# Patient Record
Sex: Female | Born: 1950 | Race: White | Hispanic: No | Smoking: Former smoker
Health system: Southern US, Community
[De-identification: ages and names within clinical notes are randomized; demographics above are authoritative.]

## PROBLEM LIST (undated history)

## (undated) DIAGNOSIS — T7840XA Allergy, unspecified, initial encounter: Secondary | ICD-10-CM

## (undated) DIAGNOSIS — E785 Hyperlipidemia, unspecified: Secondary | ICD-10-CM

## (undated) DIAGNOSIS — E119 Type 2 diabetes mellitus without complications: Secondary | ICD-10-CM

## (undated) DIAGNOSIS — I10 Essential (primary) hypertension: Secondary | ICD-10-CM

## (undated) DIAGNOSIS — K219 Gastro-esophageal reflux disease without esophagitis: Secondary | ICD-10-CM

## (undated) HISTORY — DX: Allergy, unspecified, initial encounter: T78.40XA

## (undated) HISTORY — PX: POLYPECTOMY: SHX149

## (undated) HISTORY — DX: Gastro-esophageal reflux disease without esophagitis: K21.9

## (undated) HISTORY — DX: Essential (primary) hypertension: I10

## (undated) HISTORY — DX: Hyperlipidemia, unspecified: E78.5

## (undated) HISTORY — DX: Type 2 diabetes mellitus without complications: E11.9

---
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
## 2001-03-22 ENCOUNTER — Other Ambulatory Visit: Admission: RE | Admit: 2001-03-22 | Discharge: 2001-03-22 | Payer: Self-pay | Admitting: Obstetrics & Gynecology

## 2002-06-01 ENCOUNTER — Other Ambulatory Visit: Admission: RE | Admit: 2002-06-01 | Discharge: 2002-06-01 | Payer: Self-pay | Admitting: Obstetrics & Gynecology

## 2002-07-19 HISTORY — PX: DILATION AND CURETTAGE OF UTERUS: SHX78

## 2003-06-05 ENCOUNTER — Other Ambulatory Visit: Admission: RE | Admit: 2003-06-05 | Discharge: 2003-06-05 | Payer: Self-pay | Admitting: Obstetrics & Gynecology

## 2004-08-05 ENCOUNTER — Other Ambulatory Visit: Admission: RE | Admit: 2004-08-05 | Discharge: 2004-08-05 | Payer: Self-pay | Admitting: Obstetrics & Gynecology

## 2005-08-24 ENCOUNTER — Other Ambulatory Visit: Admission: RE | Admit: 2005-08-24 | Discharge: 2005-08-24 | Payer: Self-pay | Admitting: Obstetrics & Gynecology

## 2008-08-16 ENCOUNTER — Encounter (INDEPENDENT_AMBULATORY_CARE_PROVIDER_SITE_OTHER): Payer: Self-pay | Admitting: Obstetrics & Gynecology

## 2008-08-16 ENCOUNTER — Ambulatory Visit (HOSPITAL_COMMUNITY): Admission: RE | Admit: 2008-08-16 | Discharge: 2008-08-16 | Payer: Self-pay | Admitting: Obstetrics & Gynecology

## 2010-07-22 ENCOUNTER — Encounter
Admission: RE | Admit: 2010-07-22 | Discharge: 2010-07-22 | Payer: Self-pay | Source: Home / Self Care | Attending: Obstetrics & Gynecology | Admitting: Obstetrics & Gynecology

## 2010-07-29 ENCOUNTER — Encounter: Payer: Self-pay | Admitting: Gastroenterology

## 2010-08-20 NOTE — Letter (Signed)
Summary: Colonoscopy Date Change Letter  [facility]  [address] [place], [place] [address]   Phone: [phone]  Fax: [phone]      July 29, 2010 MRN: [id number]   [facility] [address] [place], [place]  [address]   Dear Ms. Lackey,   Previously you were recommended to have a repeat colonoscopy around this time. Your chart was recently reviewed by Dr. Claudette Head of [facility]. Follow up colonoscopy is now recommended in January 2015. This revised recommendation is based on current, nationally recognized guidelines for colorectal cancer screening and polyp surveillance. These guidelines are endorsed by the American Cancer Society, [employer] on Colorectal Cancer as well as numerous other major medical organizations.  Please understand that our recommendation assumes that you do not have any new symptoms such as bleeding, a change in bowel habits, anemia, or significant abdominal discomfort. If you do have any concerning GI symptoms or want to discuss the guideline recommendations, please call to arrange an office visit at your earliest convenience. Otherwise we will keep you in our reminder system and contact you 1-2 months prior to the date listed above to schedule your next colonoscopy.  Thank you,  Judie Petit T. Russella Dar, M.D.  [facility] [phone]

## 2010-11-02 LAB — CBC
HCT: 44.2 % (ref 36.0–46.0)
Hemoglobin: 14.8 g/dL (ref 12.0–15.0)
MCHC: 33.5 g/dL (ref 30.0–36.0)
MCV: 89.9 fL (ref 78.0–100.0)
Platelets: 222 10*3/uL (ref 150–400)
RBC: 4.92 MIL/uL (ref 3.87–5.11)
RDW: 14.4 % (ref 11.5–15.5)
WBC: 5.6 10*3/uL (ref 4.0–10.5)

## 2010-12-01 NOTE — Op Note (Signed)
NAMESHAMARRA, Brooke Moore                 ACCOUNT NO.:  [id number]   MEDICAL RECORD NO.:  [id number]          PATIENT TYPE:  AMB   LOCATION:  SDC                           FACILITY:  [facility]   PHYSICIAN:  Ilda Mori, M.D.   DATE OF BIRTH:  1951-01-26   DATE OF PROCEDURE:  DATE OF DISCHARGE:                               OPERATIVE REPORT   PREOPERATIVE DIAGNOSES:  Postmenopausal bleeding and intrauterine mass.   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   PROCEDURE:  Hysteroscopy, myomectomy, and D&C.   SURGEON:  Ilda Mori, MD.   ANESTHESIA:  General.   ESTIMATED BLOOD LOSS:  Minimal.   FINDINGS:  There was a 2-3 cm submucous myoma.  There were two small 2-  mm cervical polyps.  The endometrium otherwise looked normal on  hysteroscopy, and there was no specimen obtained on D&C.   INDICATIONS:  This is a 60 year old gravida 2, para 2 female who has had  several episodes of abnormal vaginal bleeding.  The first occurred in  2006 as a perimenopausal period.  Office [place] at that time revealed a  simple hyperplasia with no atypia.  Two years later when she was in the  postmenopausal state, she had an episode of bleeding.  An ultrasound was  performed, which showed a thickened endometrial stripe of 1.4 cm and a  possible mass noted.  On office endometrial biopsy, inactive endometrium  was obtained.  The patient had another episode of postmenopausal  bleeding in December 2009 and decision was made to proceed with  hysteroscopy and D&C.   PROCEDURE IN DETAIL:  The patient was taken to the operating room and  general anesthesia was induced.  She was then placed in dorsal supine  position and the vulva and vagina and perineum were prepped and draped  in sterile fashion.  The bladder was catheterized.  The anterior lip of  cervix grasped with single-tooth tenaculum and then the uterus sounded  to 7 cm.  The internal os was dilated with Shawnie Pons  dilators to a 90-  [place].  A diagnostic scope was introduced and a 2-3 cm submucosal myoma  was identified.  The rest of the endometrium appeared normal.  The  internal os was dilated to a 25-French and a resectoscope was introduced  and the myoma was shaved down with a double loop resectoscope.  Fragments of the myoma were removed progressively.  Finally, the myoma  was grasped with a polyp forceps and the remainder of the myoma was  removed with traction with the polyp forceps.  The  hysteroscope was reintroduced and the myoma was completely removed and  the rest of the endometrium appeared normal.  A D&C was performed at  this time and no significant tissue was obtained.  The procedure was  then terminated, and the patient left the operating room in good  condition.      Ilda Mori, M.D.  Electronically Signed     RK/MEDQ  D:  08/16/2008  T:  08/16/2008  Job:  [id number]

## 2011-06-11 ENCOUNTER — Encounter: Payer: Self-pay | Admitting: *Deleted

## 2011-06-11 ENCOUNTER — Emergency Department (HOSPITAL_BASED_OUTPATIENT_CLINIC_OR_DEPARTMENT_OTHER)
Admission: EM | Admit: 2011-06-11 | Discharge: 2011-06-11 | Disposition: A | Discharge status: Home / Self Care | Payer: BC Managed Care – PPO | Attending: Emergency Medicine | Admitting: Emergency Medicine

## 2011-06-11 DIAGNOSIS — M7710 Lateral epicondylitis, unspecified elbow: Secondary | ICD-10-CM | POA: Insufficient documentation

## 2011-06-11 DIAGNOSIS — F17200 Nicotine dependence, unspecified, uncomplicated: Secondary | ICD-10-CM | POA: Insufficient documentation

## 2011-06-11 DIAGNOSIS — M79609 Pain in unspecified limb: Secondary | ICD-10-CM | POA: Insufficient documentation

## 2011-06-11 DIAGNOSIS — IMO0002 Reserved for concepts with insufficient information to code with codable children: Secondary | ICD-10-CM

## 2011-06-11 MED ORDER — IBUPROFEN 600 MG PO TABS: 600.0000 mg | ORAL_TABLET | Freq: Four times a day (QID) | ORAL | Status: AC | PRN

## 2011-06-11 MED ORDER — IBUPROFEN 400 MG PO TABS
600.0000 mg | ORAL_TABLET | Freq: Once | ORAL | Status: AC
  Administered 2011-06-11: 600 mg via ORAL
  Filled 2011-06-11: qty 1

## 2011-06-11 NOTE — ED Provider Notes (Signed)
History     CSN: [id number] Arrival date & time: 06/11/2011 10:54 AM   None     Chief Complaint  Patient presents with  . Arm Pain    (Consider location/radiation/quality/duration/timing/severity/associated sxs/prior treatment) Patient is a 60 y.o. female presenting with arm pain. The history is provided by the patient.  Arm Pain Pertinent negatives include no chest pain and no shortness of breath.  pt c/o left elbow pain laterally for past few days. Constant. Dull. Worse w palpation over area soreness. No pain w flex/ext of elbow. States occasionally is getting numbness/tingling sensation diffusely to palm of hand. No neck, shoulder or radicular pain. No weakness or loss of dexterity or function. No injury or strain. No new sports or new repetitive use arm - work does involve mod amt time spent at computer/typing. No wrist or hand pain. No hx tendonitis in elbow. No hx carpal tunnel. No swelling to forearm or hand. No skin changes or erythema, no rash.   History reviewed. No pertinent past medical history.  Past Surgical History  Procedure Date  . Tonsillectomy   . Dilation and curettage of uterus     No family history on file.  History  Substance Use Topics  . Smoking status: Current Everyday Smoker  . Smokeless tobacco: Not on file  . Alcohol Use: Yes    OB History    Grav Para Term Preterm Abortions TAB SAB Ect Mult Living                  Review of Systems  Constitutional: Negative for fever and chills.  Respiratory: Negative for shortness of breath.   Cardiovascular: Negative for chest pain.  Skin: Negative for rash.  Neurological: Negative for weakness.    Allergies  Review of patient's allergies indicates no known allergies.  Home Medications  No current outpatient prescriptions on file.  BP 161/89  Pulse 78  Temp 97.8 F (36.6 C)  Resp 18  Physical Exam  Nursing note and vitals reviewed. Constitutional: She appears well-developed and  well-nourished. No distress.  Eyes: Conjunctivae are normal. No scleral icterus.  Neck: Neck supple. No tracheal deviation present.  Cardiovascular: Normal rate.   Pulmonary/Chest: Effort normal. No respiratory distress.  Abdominal: Normal appearance.  Musculoskeletal: She exhibits no edema.       Focal pain/tenderness lat epicondyle elbow. Good rom at shoulder, elbow, and wrist without pain. Radial pulse 2+. No swelling of forearm or hand. Hand nvi.   Lymphadenopathy:       No epitroch or axillary l/a  Neurological: She is alert.       Motor intact  Skin: Skin is warm and dry. No rash noted.  Psychiatric: She has a normal mood and affect.    ED Course  Procedures (including critical care time)  Labs Reviewed - No data to display No results found.   No diagnosis found.    MDM  Exam c/w epicondylitis/tendonitis. Motrin po.         Suzi Roots, MD 06/11/11 1115

## 2011-06-11 NOTE — ED Notes (Signed)
Pt c/o left arm pain from the elbow down x3 days. Pt sts today her left fingers are tingling and her hand is itching.

## 2013-06-20 ENCOUNTER — Encounter: Payer: Self-pay | Admitting: Gastroenterology

## 2013-12-20 ENCOUNTER — Encounter: Payer: Self-pay | Admitting: Gastroenterology

## 2014-01-02 ENCOUNTER — Telehealth: Payer: Self-pay | Admitting: Gastroenterology

## 2014-01-02 ENCOUNTER — Encounter: Payer: Self-pay | Admitting: Gastroenterology

## 2014-01-02 NOTE — Telephone Encounter (Signed)
error 

## 2014-02-07 ENCOUNTER — Ambulatory Visit (AMBULATORY_SURGERY_CENTER): Payer: Self-pay | Admitting: *Deleted

## 2014-02-07 VITALS — Ht 63.0 in | Wt 178.8 lb

## 2014-02-07 DIAGNOSIS — Z1211 Encounter for screening for malignant neoplasm of colon: Secondary | ICD-10-CM

## 2014-02-07 MED ORDER — MOVIPREP 100 G PO SOLR: ORAL | Status: DC

## 2014-02-07 NOTE — Progress Notes (Signed)
No allergies to eggs or soy. No problems with anesthesia.  Pt given Emmi instructions for colonoscopy  No oxygen use  No diet drug use  

## 2014-02-13 ENCOUNTER — Encounter: Payer: BC Managed Care – PPO | Admitting: Gastroenterology

## 2014-02-19 ENCOUNTER — Encounter: Payer: Self-pay | Admitting: Gastroenterology

## 2014-02-19 ENCOUNTER — Ambulatory Visit (AMBULATORY_SURGERY_CENTER): Payer: BC Managed Care – PPO | Admitting: Gastroenterology

## 2014-02-19 VITALS — BP 118/75 | HR 63 | Temp 98.8°F | Resp 27 | Ht 63.0 in | Wt 178.0 lb

## 2014-02-19 DIAGNOSIS — D126 Benign neoplasm of colon, unspecified: Secondary | ICD-10-CM

## 2014-02-19 DIAGNOSIS — Z1211 Encounter for screening for malignant neoplasm of colon: Secondary | ICD-10-CM

## 2014-02-19 HISTORY — PX: COLONOSCOPY: SHX174

## 2014-02-19 MED ORDER — SODIUM CHLORIDE 0.9 % IV SOLN: 500.0000 mL | INTRAVENOUS | Status: DC

## 2014-02-19 NOTE — Op Note (Signed)
[facility]  [address] [place], [address]   COLONOSCOPY PROCEDURE REPORT PATIENT: Brooke Moore, Brooke Moore  MR#: [id number] BIRTHDATE: April 22, 1951 , 63  yrs. old GENDER: Female ENDOSCOPIST: Ladene Artist, MD, [facility] REFERRED BY: Earley Brooke, MD PROCEDURE DATE:  02/19/2014 PROCEDURE:   Colonoscopy with biopsy and snare polypectomy First Screening Colonoscopy - Avg.  risk and is 50 yrs.  old or older - No.  Prior Negative Screening - Now for repeat screening. 10 or more years since last screening  History of Adenoma - Now for follow-up colonoscopy & has been > or = to 3 yrs.  N/A  Polyps Removed Today? Yes. ASA CLASS:   Class II INDICATIONS:average risk screening. MEDICATIONS: MAC sedation, administered by CRNA and propofol (Diprivan) 340mg  IV DESCRIPTION OF PROCEDURE:   After the risks benefits and alternatives of the procedure were thoroughly explained, informed consent was obtained.  A digital rectal exam revealed no abnormalities of the rectum.   The LB [id number]  endoscope was introduced through the anus and advanced to the cecum, which was identified by both the appendix and ileocecal valve. No adverse events experienced.   The quality of the prep was excellent, using MoviPrep  The instrument was then slowly withdrawn as the colon was fully examined.  COLON FINDINGS: Two sessile polyps measuring 5 mm in size were found in the transverse colon.  A polypectomy was performed with a cold snare.  The resection was complete and the polyp tissue was completely retrieved.   Four sessile polyps ranging between 3-68mm in size were found in the sigmoid colon.  A polypectomy was performed with a cold snare and with cold forceps.  The resection was complete and the polyp tissue was completely retrieved.   Mild diverticulosis was noted in the sigmoid colon.   The colon was otherwise normal.  There was no diverticulosis, inflammation, polyps or cancers unless previously  stated.  Retroflexed views revealed no abnormalities. The time to cecum=2 minutes 15 seconds. Withdrawal time=13 minutes 10 seconds.  The scope was withdrawn and the procedure completed. COMPLICATIONS: There were no complications.  ENDOSCOPIC IMPRESSION: 1.   Two sessile polyps measuring 5 mm in the transverse colon; polypectomy performed with a cold snare 2.   Four sessile polyps ranging between 3-29mm in the sigmoid colon; polypectomy performed- cold snare and cold forceps 3.   Mild diverticulosis in the sigmoid colon  RECOMMENDATIONS: 1.  Await pathology results 2.  Repeat colonoscopy in 5 years if polyp(s) adenomatous; otherwise 10 years 3.  High fiber diet with liberal fluid intake.  eSigned:  Ladene Artist, MD, [facility] 02/19/2014 2:02 PM

## 2014-02-19 NOTE — Progress Notes (Signed)
Called to room to assist during endoscopic procedure.  Patient ID and intended procedure confirmed with present staff. Received instructions for my participation in the procedure from the performing physician.  

## 2014-02-19 NOTE — Patient Instructions (Signed)
YOU HAD AN ENDOSCOPIC PROCEDURE TODAY AT [facility]: Refer to the procedure report that was given to you for any specific questions about what was found during the examination.  If the procedure report does not answer your questions, please call your gastroenterologist to clarify.  If you requested that your care partner not be given the details of your procedure findings, then the procedure report has been included in a sealed envelope for you to review at your convenience later.  YOU SHOULD EXPECT: Some feelings of bloating in the abdomen. Passage of more gas than usual.  Walking can help get rid of the air that was put into your GI tract during the procedure and reduce the bloating. If you had a lower endoscopy (such as a colonoscopy or flexible sigmoidoscopy) you may notice spotting of blood in your stool or on the toilet paper. If you underwent a bowel prep for your procedure, then you may not have a normal bowel movement for a few days.  DIET: Your first meal following the procedure should be a light meal and then it is ok to progress to your normal diet.  A half-sandwich or bowl of soup is an example of a good first meal.  Heavy or fried foods are harder to digest and may make you feel nauseous or bloated.  Likewise meals heavy in dairy and vegetables can cause extra gas to form and this can also increase the bloating.  Drink plenty of fluids but you should avoid alcoholic beverages for 24 hours.Try to eat a high fiber diet.  ACTIVITY: Your care partner should take you home directly after the procedure.  You should plan to take it easy, moving slowly for the rest of the day.  You can resume normal activity the day after the procedure however you should NOT DRIVE or use heavy machinery for 24 hours (because of the sedation medicines used during the test).    SYMPTOMS TO REPORT IMMEDIATELY: A gastroenterologist can be reached at any hour.  During normal business hours, 8:30 AM to 5:00  PM Monday through Friday, call [phone].  After hours and on weekends, please call the GI answering service at [phone] who will take a message and have the physician on call contact you.   Following lower endoscopy (colonoscopy or flexible sigmoidoscopy):  Excessive amounts of blood in the stool  Significant tenderness or worsening of abdominal pains  Swelling of the abdomen that is new, acute  Fever of 100F or higher  FOLLOW UP: If any biopsies were taken you will be contacted by phone or by letter within the next 1-3 weeks.  Call your gastroenterologist if you have not heard about the biopsies in 3 weeks.  Our staff will call the home number listed on your records the next business day following your procedure to check on you and address any questions or concerns that you may have at that time regarding the information given to you following your procedure. This is a courtesy call and so if there is no answer at the home number and we have not heard from you through the emergency physician on call, we will assume that you have returned to your regular daily activities without incident.  SIGNATURES/CONFIDENTIALITY: You and/or your care partner have signed paperwork which will be entered into your electronic medical record.  These signatures attest to the fact that that the information above on your After Visit Summary has been reviewed and is understood.  Full  responsibility of the confidentiality of this discharge information lies with you and/or your care-partner.  Please, read all of the handouts given to you by your recovery room nurse.

## 2014-02-19 NOTE — Progress Notes (Signed)
Report to PACU, RN, vss, BBS= Clear.  

## 2014-02-20 ENCOUNTER — Telehealth: Payer: Self-pay | Admitting: *Deleted

## 2014-02-20 NOTE — Telephone Encounter (Signed)
  Follow up Call-  Call back number 02/19/2014  Post procedure Call Back phone  # [phone]  Permission to leave phone message Yes     Patient questions:  Do you have a fever, pain , or abdominal swelling? No. Pain Score  0 *  Have you tolerated food without any problems? Yes.    Have you been able to return to your normal activities? Yes.    Do you have any questions about your discharge instructions: Diet   No. Medications  No. Follow up visit  No.  Do you have questions or concerns about your Care? No.  Actions: * If pain score is 4 or above: No action needed, pain <4.

## 2014-02-28 ENCOUNTER — Encounter: Payer: Self-pay | Admitting: Gastroenterology

## 2016-01-09 ENCOUNTER — Other Ambulatory Visit: Payer: Self-pay

## 2016-01-09 ENCOUNTER — Emergency Department (HOSPITAL_BASED_OUTPATIENT_CLINIC_OR_DEPARTMENT_OTHER)
Admission: EM | Admit: 2016-01-09 | Discharge: 2016-01-09 | Disposition: A | Discharge status: Home / Self Care | Payer: BLUE CROSS/BLUE SHIELD | Attending: Emergency Medicine | Admitting: Emergency Medicine

## 2016-01-09 ENCOUNTER — Encounter (HOSPITAL_BASED_OUTPATIENT_CLINIC_OR_DEPARTMENT_OTHER): Payer: Self-pay

## 2016-01-09 DIAGNOSIS — E785 Hyperlipidemia, unspecified: Secondary | ICD-10-CM | POA: Insufficient documentation

## 2016-01-09 DIAGNOSIS — Z79899 Other long term (current) drug therapy: Secondary | ICD-10-CM | POA: Diagnosis not present

## 2016-01-09 DIAGNOSIS — I10 Essential (primary) hypertension: Secondary | ICD-10-CM | POA: Diagnosis not present

## 2016-01-09 DIAGNOSIS — Z87891 Personal history of nicotine dependence: Secondary | ICD-10-CM | POA: Insufficient documentation

## 2016-01-09 DIAGNOSIS — H81399 Other peripheral vertigo, unspecified ear: Secondary | ICD-10-CM | POA: Diagnosis not present

## 2016-01-09 DIAGNOSIS — R1110 Vomiting, unspecified: Secondary | ICD-10-CM | POA: Insufficient documentation

## 2016-01-09 DIAGNOSIS — R42 Dizziness and giddiness: Secondary | ICD-10-CM | POA: Diagnosis present

## 2016-01-09 LAB — CBG MONITORING, ED: Glucose-Capillary: 168 mg/dL — ABNORMAL HIGH (ref 65–99)

## 2016-01-09 MED ORDER — MECLIZINE HCL 25 MG PO TABS: 25.0000 mg | ORAL_TABLET | Freq: Three times a day (TID) | ORAL | Status: DC | PRN

## 2016-01-09 MED ORDER — MECLIZINE HCL 25 MG PO TABS
25.0000 mg | ORAL_TABLET | Freq: Once | ORAL | Status: AC
  Administered 2016-01-09: 25 mg via ORAL
  Filled 2016-01-09: qty 1

## 2016-01-09 NOTE — Discharge Instructions (Signed)
Benign Positional Vertigo Vertigo is the feeling that you or your surroundings are moving when they are not. Benign positional vertigo is the most common form of vertigo. The cause of this condition is not serious (is benign). This condition is triggered by certain movements and positions (is positional). This condition can be dangerous if it occurs while you are doing something that could endanger you or others, such as driving.  CAUSES In many cases, the cause of this condition is not known. It may be caused by a disturbance in an area of the inner ear that helps your brain to sense movement and balance. This disturbance can be caused by a viral infection (labyrinthitis), head injury, or repetitive motion. RISK FACTORS This condition is more likely to develop in:  Women.  People who are 50 years of age or older. SYMPTOMS Symptoms of this condition usually happen when you move your head or your eyes in different directions. Symptoms may start suddenly, and they usually last for less than a minute. Symptoms may include:  Loss of balance and falling.  Feeling like you are spinning or moving.  Feeling like your surroundings are spinning or moving.  Nausea and vomiting.  Blurred vision.  Dizziness.  Involuntary eye movement (nystagmus). Symptoms can be mild and cause only slight annoyance, or they can be severe and interfere with daily life. Episodes of benign positional vertigo may return (recur) over time, and they may be triggered by certain movements. Symptoms may improve over time. DIAGNOSIS This condition is usually diagnosed by medical history and a physical exam of the head, neck, and ears. You may be referred to a health care provider who specializes in ear, nose, and throat (ENT) problems (otolaryngologist) or a provider who specializes in disorders of the nervous system (neurologist). You may have additional testing, including:  MRI.  A CT scan.  Eye movement tests. Your  health care provider may ask you to change positions quickly while he or she watches you for symptoms of benign positional vertigo, such as nystagmus. Eye movement may be tested with an electronystagmogram (ENG), caloric stimulation, the Dix-Hallpike test, or the roll test.  An electroencephalogram (EEG). This records electrical activity in your brain.  Hearing tests. TREATMENT Usually, your health care provider will treat this by moving your head in specific positions to adjust your inner ear back to normal. Surgery may be needed in severe cases, but this is rare. In some cases, benign positional vertigo may resolve on its own in 2-4 weeks. HOME CARE INSTRUCTIONS Safety  Move slowly.Avoid sudden body or head movements.  Avoid driving.  Avoid operating heavy machinery.  Avoid doing any tasks that would be dangerous to you or others if a vertigo episode would occur.  If you have trouble walking or keeping your balance, try using a cane for stability. If you feel dizzy or unstable, sit down right away.  Return to your normal activities as told by your health care provider. Ask your health care provider what activities are safe for you. General Instructions  Take over-the-counter and prescription medicines only as told by your health care provider.  Avoid certain positions or movements as told by your health care provider.  Drink enough fluid to keep your urine clear or pale yellow.  Keep all follow-up visits as told by your health care provider. This is important. SEEK MEDICAL CARE IF:  You have a fever.  Your condition gets worse or you develop new symptoms.  Your family or friends   notice any behavioral changes.  Your nausea or vomiting gets worse.  You have numbness or a "pins and needles" sensation. SEEK IMMEDIATE MEDICAL CARE IF:  You have difficulty speaking or moving.  You are always dizzy.  You faint.  You develop severe headaches.  You have weakness in your  legs or arms.  You have changes in your hearing or vision.  You develop a stiff neck.  You develop sensitivity to light.   This information is not intended to replace advice given to you by your health care provider. Make sure you discuss any questions you have with your health care provider.   Document Released: 04/12/2006 Document Revised: 03/26/2015 Document Reviewed: 10/28/2014 Elsevier Interactive Patient Education 2016 [employer]  

## 2016-01-09 NOTE — ED Provider Notes (Signed)
CSN: [id number]     Arrival date & time 01/09/16  1849 History  By signing my name below, I, Brooke Moore, attest that this documentation has been prepared under the direction and in the presence of Brooke Etienne, DO.  Electronically signed: Roxine Moore, ED Scribe. 01/09/2016. 10:56 PM.  Chief Complaint  Patient presents with  . Dizziness   The history is provided by the patient. No language interpreter was used.   HPI Comments: Brooke Moore is a 65 y.o. female who presents to the Emergency Department complaining of sudden onset, gradual improving dizziness that she describes as off-balance PTA. Pt reports associated vomiting. She states she was working on her laptop when she felt off-balance and decided lay down. She states when she got up to go to the bathroom, she felt as though she was going to lose her balance and decided to sit down giving her no relief. Pt states she usually finds relief when closing her eyes. She states symptoms are exacerbated when standing up or sitting up. She notes symptoms improved after vomiting and coming to the emergency department. Pt denies head, neck, abdominal or chest pain, loss of appetite, trouble breathing, diarrhea, cough, congestion, or sinus problems.  Past Medical History  Diagnosis Date  . Hyperlipidemia   . Hypertension   . GERD (gastroesophageal reflux disease)    Past Surgical History  Procedure Laterality Date  . Tonsillectomy  1958  . Dilation and curettage of uterus  2004   Family History  Problem Relation Age of Onset  . Colon cancer Neg Hx    Social History  Substance Use Topics  . Smoking status: Former Smoker    Quit date: 07/20/1991  . Smokeless tobacco: Never Used  . Alcohol Use: 0.6 oz/week    1 Glasses of wine per week     Comment: occ   OB History    No data available     Review of Systems  Constitutional: Negative for fever, chills and appetite change.  HENT: Negative for congestion, rhinorrhea and sinus pressure.    Eyes: Positive for photophobia. Negative for redness and visual disturbance.  Respiratory: Negative for cough, shortness of breath and wheezing.   Cardiovascular: Negative for chest pain and palpitations.  Gastrointestinal: Positive for vomiting. Negative for nausea, abdominal pain and diarrhea.  Genitourinary: Negative for dysuria and urgency.  Musculoskeletal: Negative for myalgias and arthralgias.  Skin: Negative for pallor and wound.  Neurological: Positive for dizziness. Negative for headaches.  All other systems reviewed and are negative.     Allergies  Review of patient's allergies indicates no known allergies.  Home Medications   Prior to Admission medications   Medication Sig Start Date End Date Taking? Authorizing Provider  lisinopril (PRINIVIL,ZESTRIL) 10 MG tablet Take 10 mg by mouth daily.    Historical Provider, MD  meclizine (ANTIVERT) 25 MG tablet Take 1 tablet (25 mg total) by mouth 3 (three) times daily as needed for dizziness. 01/09/16   Brooke Etienne, DO  montelukast (SINGULAIR) 10 MG tablet Take 10 mg by mouth at bedtime.    Historical Provider, MD  pravastatin (PRAVACHOL) 20 MG tablet Take 20 mg by mouth daily.    Historical Provider, MD  ranitidine (ZANTAC) 150 MG capsule Take 150 mg by mouth 2 (two) times daily.    Historical Provider, MD   BP 131/68 mmHg  Pulse 61  Temp(Src) 98.1 F (36.7 C) (Oral)  Resp 16  Ht 5\' 3"  (1.6 m)  Wt 173 lb (  78.472 kg)  BMI 30.65 kg/m2  SpO2 98% Physical Exam  Constitutional: She is oriented to person, place, and time. She appears well-developed and well-nourished. No distress.  HENT:  Head: Normocephalic and atraumatic.  Eyes: EOM are normal. Pupils are equal, round, and reactive to light.  Neck: Normal range of motion. Neck supple.  Cardiovascular: Normal rate and regular rhythm.  Exam reveals no gallop and no friction rub.   No murmur heard. Pulmonary/Chest: Effort normal. She has no wheezes. She has no rales.   Abdominal: Soft. She exhibits no distension. There is no tenderness.  Musculoskeletal: She exhibits no edema or tenderness.  Neurological: She is alert and oriented to person, place, and time.  Benign neurological exam  Skin: Skin is warm and dry. She is not diaphoretic.  Psychiatric: She has a normal mood and affect. Her behavior is normal.  Nursing note and vitals reviewed.   ED Course  Procedures  DIAGNOSTIC STUDIES: Oxygen Saturation is 100% on RA, normal by my interpretation.  COORDINATION OF CARE: 7:29 PM Discussed treatment plan which includes prescribing meclizine with pt at bedside and pt agreed to plan.  Labs Review Labs Reviewed  CBG MONITORING, ED - Abnormal; Notable for the following:    Glucose-Capillary 168 (*)    All other components within normal limits    Imaging Review No results found. I have personally reviewed and evaluated these images and lab results as part of my medical decision-making.   EKG Interpretation   Date/Time:  Friday January 09 2016 19:02:26 EDT Ventricular Rate:  76 PR Interval:  166 QRS Duration: 80 QT Interval:  386 QTC Calculation: 434 R Axis:   76 Text Interpretation:  Normal sinus rhythm Normal ECG No old tracing to  compare Confirmed by Cathlene Gardella MD, DANIEL [phone]) on 01/09/2016 10:56:14 PM      MDM   Final diagnoses:  Peripheral vertigo, unspecified laterality    65 yo F With a chief complaint of dizziness. He describes this as unsteadiness on her feet. Worse with head movements and bending over. Denies pain. Benign neurologic exam. Patient feels that her dizziness is passed. Deny chest pain or shortness of breath with this. We'll give meclizine. Doubt stroke.   10:56 PM:  I have discussed the diagnosis/risks/treatment options with the patient and family and believe the pt to be eligible for discharge home to follow-up with PCP. We also discussed returning to the ED immediately if new or worsening sx occur. We discussed the sx  which are most concerning (e.g., sudden worsening pain, fever, inability to tolerate by mouth) that necessitate immediate return. Medications administered to the patient during their visit and any new prescriptions provided to the patient are listed below.  Medications given during this visit Medications  meclizine (ANTIVERT) tablet 25 mg (25 mg Oral Given 01/09/16 1952)    Discharge Medication List as of 01/09/2016  8:02 PM    START taking these medications   Details  meclizine (ANTIVERT) 25 MG tablet Take 1 tablet (25 mg total) by mouth 3 (three) times daily as needed for dizziness., Starting 01/09/2016, Until Discontinued, Print        The patient appears reasonably screen and/or stabilized for discharge and I doubt any other medical condition or other [facility] requiring further screening, evaluation, or treatment in the ED at this time prior to discharge.    I personally performed the services described in this documentation, which was scribed in my presence. The recorded information has been reviewed and is  accurate.    Brooke Etienne, DO 01/09/16 2256

## 2016-01-09 NOTE — ED Notes (Signed)
C/o sudden onset dizziness at 4pm-vomited later-denies HA,CP-pt was assisted from car to w/c to ED WR to triage-A/O-NAD

## 2016-06-25 ENCOUNTER — Other Ambulatory Visit: Payer: Self-pay | Admitting: Obstetrics & Gynecology

## 2016-06-25 DIAGNOSIS — R928 Other abnormal and inconclusive findings on diagnostic imaging of breast: Secondary | ICD-10-CM

## 2016-07-06 ENCOUNTER — Ambulatory Visit
Admission: RE | Admit: 2016-07-06 | Discharge: 2016-07-06 | Disposition: A | Discharge status: Home / Self Care | Payer: BLUE CROSS/BLUE SHIELD | Source: Ambulatory Visit | Attending: Obstetrics & Gynecology | Admitting: Obstetrics & Gynecology

## 2016-07-06 DIAGNOSIS — R928 Other abnormal and inconclusive findings on diagnostic imaging of breast: Secondary | ICD-10-CM

## 2019-02-12 ENCOUNTER — Encounter: Payer: Self-pay | Admitting: Gastroenterology

## 2019-02-16 ENCOUNTER — Encounter: Payer: Self-pay | Admitting: Gastroenterology

## 2019-03-21 ENCOUNTER — Ambulatory Visit (AMBULATORY_SURGERY_CENTER): Payer: Self-pay | Admitting: *Deleted

## 2019-03-21 ENCOUNTER — Other Ambulatory Visit: Payer: Self-pay

## 2019-03-21 VITALS — Temp 96.8°F | Ht 63.0 in | Wt 172.0 lb

## 2019-03-21 DIAGNOSIS — Z86010 Personal history of colonic polyps: Secondary | ICD-10-CM

## 2019-03-21 MED ORDER — NA SULFATE-K SULFATE-MG SULF 17.5-3.13-1.6 GM/177ML PO SOLN: ORAL | 0 refills | Status: DC

## 2019-03-21 NOTE — Progress Notes (Signed)
Patient is here in-person for PV. Patient denies any allergies to eggs or soy. Patient denies any problems with anesthesia/sedation. Patient denies any oxygen use at home. Patient denies taking any diet/weight loss medications or blood thinners. EMMI education assisgned to patient on colonoscopy, this was explained and instructions given to patient.Pt is aware that care partner will wait in the car during procedure; if they feel like they will be too hot to wait in the car; they may wait in the lobby.  We want them to wear a mask (we do not have any that we can provide them), practice social distancing, and we will check their temperatures when they get here.  I did remind patient that their care partner needs to stay in the parking lot the entire time. Pt will wear mask into building. Suprep coupon given to pt.

## 2019-03-27 ENCOUNTER — Encounter: Payer: Self-pay | Admitting: Gastroenterology

## 2019-04-04 ENCOUNTER — Encounter: Payer: BLUE CROSS/BLUE SHIELD | Admitting: Gastroenterology

## 2019-04-06 ENCOUNTER — Telehealth: Payer: Self-pay | Admitting: Gastroenterology

## 2019-04-06 NOTE — Telephone Encounter (Signed)

## 2019-04-09 ENCOUNTER — Other Ambulatory Visit: Payer: Self-pay

## 2019-04-09 ENCOUNTER — Ambulatory Visit (AMBULATORY_SURGERY_CENTER): Payer: BC Managed Care – PPO | Admitting: Gastroenterology

## 2019-04-09 ENCOUNTER — Encounter: Payer: Self-pay | Admitting: Gastroenterology

## 2019-04-09 ENCOUNTER — Other Ambulatory Visit: Payer: Self-pay | Admitting: Gastroenterology

## 2019-04-09 VITALS — BP 119/71 | HR 57 | Temp 98.0°F | Resp 14 | Ht 63.0 in | Wt 172.0 lb

## 2019-04-09 DIAGNOSIS — Z86010 Personal history of colonic polyps: Secondary | ICD-10-CM

## 2019-04-09 DIAGNOSIS — K635 Polyp of colon: Secondary | ICD-10-CM

## 2019-04-09 DIAGNOSIS — D125 Benign neoplasm of sigmoid colon: Secondary | ICD-10-CM

## 2019-04-09 DIAGNOSIS — D124 Benign neoplasm of descending colon: Secondary | ICD-10-CM

## 2019-04-09 DIAGNOSIS — D127 Benign neoplasm of rectosigmoid junction: Secondary | ICD-10-CM

## 2019-04-09 DIAGNOSIS — D128 Benign neoplasm of rectum: Secondary | ICD-10-CM

## 2019-04-09 MED ORDER — SODIUM CHLORIDE 0.9 % IV SOLN
500.0000 mL | Freq: Once | INTRAVENOUS | Status: DC
Start: 2019-04-09 — End: 2019-04-09

## 2019-04-09 NOTE — Progress Notes (Signed)
A/ox3, pleased with MAC, report to RN 

## 2019-04-09 NOTE — Progress Notes (Signed)
Called to room to assist during endoscopic procedure.  Patient ID and intended procedure confirmed with present staff. Received instructions for my participation in the procedure from the performing physician.  

## 2019-04-09 NOTE — Patient Instructions (Signed)
Handouts given:Polyps and Hemorrhoids   YOU HAD AN ENDOSCOPIC PROCEDURE TODAY AT THE [facility]:   Refer to the procedure report that was given to you for any specific questions about what was found during the examination.  If the procedure report does not answer your questions, please call your gastroenterologist to clarify.  If you requested that your care partner not be given the details of your procedure findings, then the procedure report has been included in a sealed envelope for you to review at your convenience later.  YOU SHOULD EXPECT: Some feelings of bloating in the abdomen. Passage of more gas than usual.  Walking can help get rid of the air that was put into your GI tract during the procedure and reduce the bloating. If you had a lower endoscopy (such as a colonoscopy or flexible sigmoidoscopy) you may notice spotting of blood in your stool or on the toilet paper. If you underwent a bowel prep for your procedure, you may not have a normal bowel movement for a few days.  Please Note:  You might notice some irritation and congestion in your nose or some drainage.  This is from the oxygen used during your procedure.  There is no need for concern and it should clear up in a day or so.  SYMPTOMS TO REPORT IMMEDIATELY:   Following lower endoscopy (colonoscopy or flexible sigmoidoscopy):  Excessive amounts of blood in the stool  Significant tenderness or worsening of abdominal pains  Swelling of the abdomen that is new, acute  Fever of 100F or higher  For urgent or emergent issues, a gastroenterologist can be reached at any hour by calling [phone].   DIET:  We do recommend a small meal at first, but then you may proceed to your regular diet.  Drink plenty of fluids but you should avoid alcoholic beverages for 24 hours.  ACTIVITY:  You should plan to take it easy for the rest of today and you should NOT DRIVE or use heavy machinery until tomorrow (because of the  sedation medicines used during the test).    FOLLOW UP: Our staff will call the number listed on your records 48-72 hours following your procedure to check on you and address any questions or concerns that you may have regarding the information given to you following your procedure. If we do not reach you, we will leave a message.  We will attempt to reach you two times.  During this call, we will ask if you have developed any symptoms of COVID 19. If you develop any symptoms (ie: fever, flu-like symptoms, shortness of breath, cough etc.) before then, please call [phone].  If you test positive for Covid 19 in the 2 weeks post procedure, please call and report this information to us.    If any biopsies were taken you will be contacted by phone or by letter within the next 1-3 weeks.  Please call us at [phone] if you have not heard about the biopsies in 3 weeks.    SIGNATURES/CONFIDENTIALITY: You and/or your care partner have signed paperwork which will be entered into your electronic medical record.  These signatures attest to the fact that that the information above on your After Visit Summary has been reviewed and is understood.  Full responsibility of the confidentiality of this discharge information lies with you and/or your care-partner. 

## 2019-04-09 NOTE — Progress Notes (Signed)
Pt's states no medical or surgical changes since previsit or office visit.  Temp KA Vitals CW 

## 2019-04-09 NOTE — Op Note (Addendum)
[facility] Patient Name: Alassandra Sobh Procedure Date: 04/09/2019 1:31 PM MRN: [id number] Endoscopist: Ladene Artist , MD Age: 68 Referring MD:  Date of Birth: 06/25/1951 Gender: Female Account #: [id number] Procedure:                Colonoscopy Indications:              High risk colon cancer surveillance: Personal                            history of sessile serrated colon polyp (less than                            10 mm in size) with no dysplasia Medicines:                Monitored Anesthesia Care Procedure:                Pre-Anesthesia Assessment:                           - Prior to the procedure, a History and Physical                            was performed, and patient medications and                            allergies were reviewed. The patient's tolerance of                            previous anesthesia was also reviewed. The risks                            and benefits of the procedure and the sedation                            options and risks were discussed with the patient.                            All questions were answered, and informed consent                            was obtained. Prior Anticoagulants: The patient has                            taken no previous anticoagulant or antiplatelet                            agents. ASA Grade Assessment: II - A patient with                            mild systemic disease. After reviewing the risks                            and benefits, the patient was deemed in  satisfactory condition to undergo the procedure.                           After obtaining informed consent, the colonoscope                            was passed under direct vision. Throughout the                            procedure, the patient's blood pressure, pulse, and                            oxygen saturations were monitored continuously. The                            Colonoscope was introduced  through the anus and                            advanced to the the cecum, identified by                            appendiceal orifice and ileocecal valve. The                            ileocecal valve, appendiceal orifice, and rectum                            were photographed. The quality of the bowel                            preparation was good. The colonoscopy was performed                            without difficulty. The patient tolerated the                            procedure well. Scope In: 1:44:15 PM Scope Out: 2:01:29 PM Scope Withdrawal Time: 0 hours 15 minutes 18 seconds  Total Procedure Duration: 0 hours 17 minutes 14 seconds  Findings:                 The perianal and digital rectal examinations were                            normal.                           Six sessile polyps were found in the rectum (2),                            sigmoid colon (1) and descending colon (3). The                            polyps were 6 to 8 mm in size. These polyps were  removed with a cold snare. Resection and retrieval                            were complete.                           A few small-mouthed diverticula were found in the                            sigmoid colon. There was no evidence of                            diverticular bleeding.                           Internal hemorrhoids were found during                            retroflexion. The hemorrhoids were small and Grade                            I (internal hemorrhoids that do not prolapse).                           The exam was otherwise without abnormality on                            direct and retroflexion views. Complications:            No immediate complications. Estimated blood loss:                            None. Estimated Blood Loss:     Estimated blood loss: none. Impression:               - Six 6 to 8 mm polyps in the rectum, in the                             sigmoid colon and in the descending colon, removed                            with a cold snare. Resected and retrieved.                           - Mild sigmoid diverticulosis.                           - Internal hemorrhoids.                           - The examination was otherwise normal on direct                            and retroflexion views. Recommendation:           - Repeat colonoscopy in 3 - 5 years for  surveillance pending pathology review.                           - Patient has a contact number available for                            emergencies. The signs and symptoms of potential                            delayed complications were discussed with the                            patient. Return to normal activities tomorrow.                            Written discharge instructions were provided to the                            patient.                           - Resume previous diet.                           - Continue present medications.                           - Await pathology results. Ladene Artist, MD 04/09/2019 2:06:02 PM This report has been signed electronically.

## 2019-04-11 ENCOUNTER — Telehealth: Payer: Self-pay

## 2019-04-11 NOTE — Telephone Encounter (Signed)
  Follow up Call-  Call back number 04/09/2019  Post procedure Call Back phone  # [phone]  Permission to leave phone message Yes  Some recent data might be hidden     Patient questions:  Do you have a fever, pain , or abdominal swelling? No. Pain Score  0 *  Have you tolerated food without any problems? Yes.    Have you been able to return to your normal activities? Yes.    Do you have any questions about your discharge instructions: Diet   No. Medications  No. Follow up visit  No.  Do you have questions or concerns about your Care? No.  Actions: * If pain score is 4 or above: No action needed, pain <4.  1. Have you developed a fever since your procedure? no  2.   Have you had an respiratory symptoms (SOB or cough) since your procedure? no  3.   Have you tested positive for COVID 19 since your procedure no  4.   Have you had any family members/close contacts diagnosed with the COVID 19 since your procedure?  no   If yes to any of these questions please route to Joylene John, RN and Alphonsa Gin, Therapist, sports.

## 2019-04-18 ENCOUNTER — Encounter: Payer: Self-pay | Admitting: Gastroenterology

## 2019-09-14 ENCOUNTER — Ambulatory Visit: Payer: BC Managed Care – PPO | Attending: Internal Medicine

## 2019-09-14 DIAGNOSIS — Z23 Encounter for immunization: Secondary | ICD-10-CM | POA: Insufficient documentation

## 2019-09-14 NOTE — Progress Notes (Signed)
   Covid-19 Vaccination Clinic  Name:  Brooke Moore    MRN: [id number] DOB: 09-08-1950  09/14/2019  [name] was observed post Covid-19 immunization for 15 minutes without incidence. She was provided with Vaccine Information Sheet and instruction to access the V-Safe system.   [name] was instructed to call 911 with any severe reactions post vaccine: Marland Kitchen Difficulty breathing  . Swelling of your face and throat  . A fast heartbeat  . A bad rash all over your body  . Dizziness and weakness    Immunizations Administered    Name Date Dose VIS Date Route   Pfizer COVID-19 Vaccine 09/14/2019 11:25 AM 0.3 mL 06/29/2019 Intramuscular   Manufacturer: [place]   Lot: [id number]   [place]: [id number]

## 2019-10-09 ENCOUNTER — Ambulatory Visit: Payer: BC Managed Care – PPO | Attending: Internal Medicine

## 2019-10-09 DIAGNOSIS — Z23 Encounter for immunization: Secondary | ICD-10-CM

## 2019-10-09 NOTE — Progress Notes (Signed)
   Covid-19 Vaccination Clinic  Name:  Brooke Moore    MRN: [id number] DOB: March 12, 1951  10/09/2019  Brooke Moore was observed post Covid-19 immunization for 15 minutes without incident. She was provided with Vaccine Information Sheet and instruction to access the V-Safe system.   Brooke Moore was instructed to call 911 with any severe reactions post vaccine: Marland Kitchen Difficulty breathing  . Swelling of face and throat  . A fast heartbeat  . A bad rash all over body  . Dizziness and weakness   Immunizations Administered    Name Date Dose VIS Date Route   Pfizer COVID-19 Vaccine 10/09/2019  1:27 PM 0.3 mL 06/29/2019 Intramuscular   Manufacturer: [place]   Lot: [id number]   [place]: [id number]

## 2020-08-01 ENCOUNTER — Other Ambulatory Visit: Payer: Self-pay | Admitting: Family Medicine

## 2020-08-01 DIAGNOSIS — Z1231 Encounter for screening mammogram for malignant neoplasm of breast: Secondary | ICD-10-CM

## 2020-09-30 ENCOUNTER — Ambulatory Visit
Admission: RE | Admit: 2020-09-30 | Discharge: 2020-09-30 | Disposition: A | Discharge status: Home / Self Care | Payer: Medicare Other | Source: Ambulatory Visit | Attending: Family Medicine | Admitting: Family Medicine

## 2020-09-30 ENCOUNTER — Other Ambulatory Visit: Payer: Self-pay

## 2020-09-30 DIAGNOSIS — Z1231 Encounter for screening mammogram for malignant neoplasm of breast: Secondary | ICD-10-CM

## 2020-10-13 ENCOUNTER — Ambulatory Visit: Payer: BC Managed Care – PPO

## 2021-09-16 ENCOUNTER — Other Ambulatory Visit: Payer: Self-pay | Admitting: Physician Assistant

## 2021-09-16 DIAGNOSIS — Z1231 Encounter for screening mammogram for malignant neoplasm of breast: Secondary | ICD-10-CM

## 2021-10-01 ENCOUNTER — Other Ambulatory Visit: Payer: Self-pay

## 2021-10-01 ENCOUNTER — Ambulatory Visit
Admission: RE | Admit: 2021-10-01 | Discharge: 2021-10-01 | Disposition: A | Discharge status: Home / Self Care | Payer: Medicare Other | Source: Ambulatory Visit | Attending: Physician Assistant | Admitting: Physician Assistant

## 2021-10-01 DIAGNOSIS — Z1231 Encounter for screening mammogram for malignant neoplasm of breast: Secondary | ICD-10-CM

## 2022-08-19 ENCOUNTER — Other Ambulatory Visit: Payer: Self-pay | Admitting: Physician Assistant

## 2022-08-19 DIAGNOSIS — Z1231 Encounter for screening mammogram for malignant neoplasm of breast: Secondary | ICD-10-CM

## 2022-10-06 ENCOUNTER — Ambulatory Visit
Admission: RE | Admit: 2022-10-06 | Discharge: 2022-10-06 | Disposition: A | Discharge status: Home / Self Care | Payer: Medicare Other | Source: Ambulatory Visit | Attending: Physician Assistant | Admitting: Physician Assistant

## 2022-10-06 DIAGNOSIS — Z1231 Encounter for screening mammogram for malignant neoplasm of breast: Secondary | ICD-10-CM

## 2023-05-30 IMAGING — MG MM DIGITAL SCREENING BILAT W/ TOMO AND CAD
8 series · 9 of 24 positions shown · non-contrast
Comparison: Previous exam(s).

CLINICAL DATA: Screening.

EXAM:
DIGITAL SCREENING BILATERAL MAMMOGRAM WITH TOMOSYNTHESIS AND CAD
TECHNIQUE: Bilateral screening digital craniocaudal and mediolateral oblique
mammograms were obtained. Bilateral screening digital breast
tomosynthesis was performed. The images were evaluated with
computer-aided detection.

[R CC synth-2D]
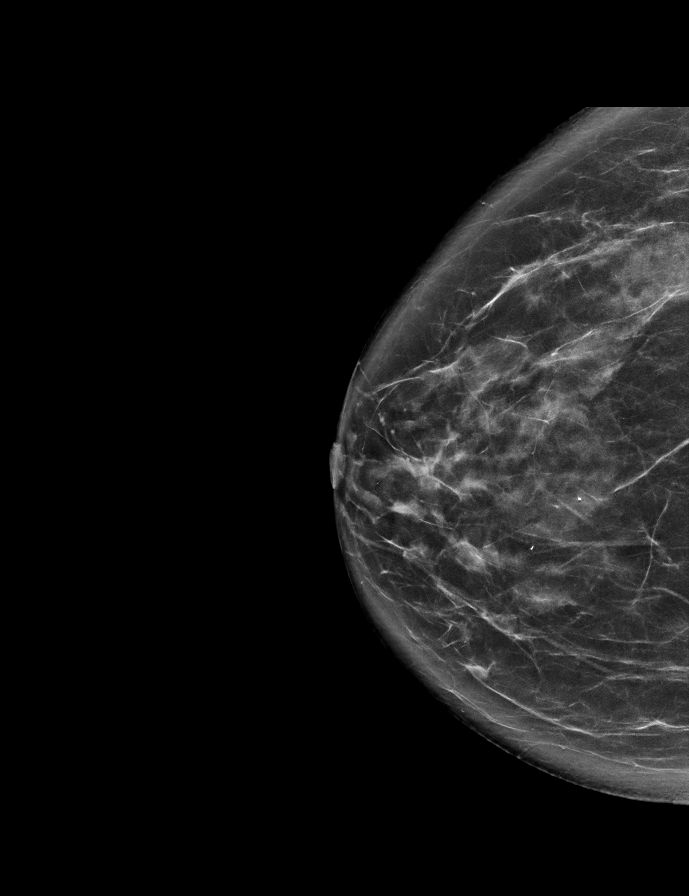

[L CC synth-2D]
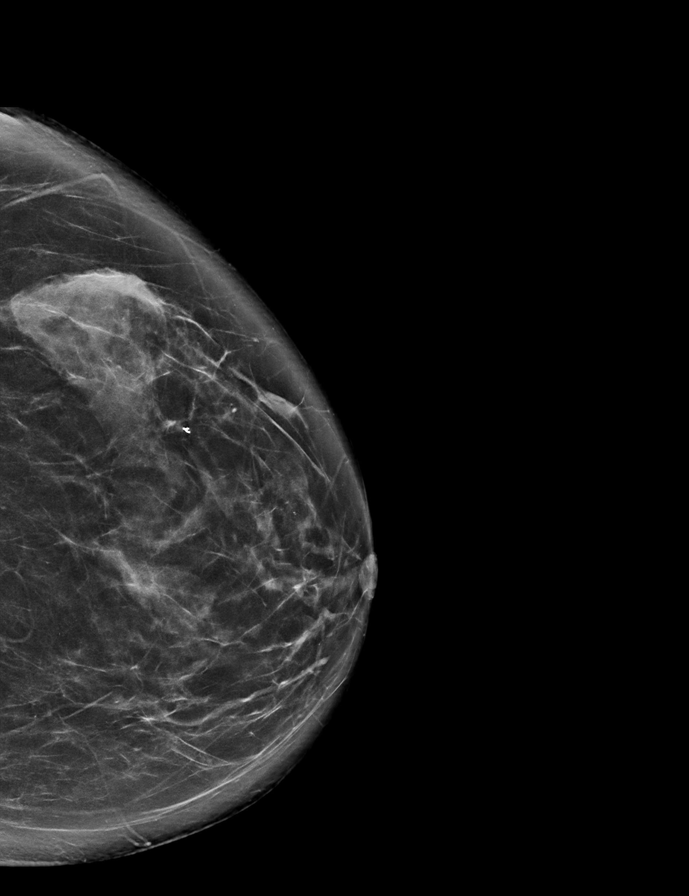

[R MLO synth-2D]
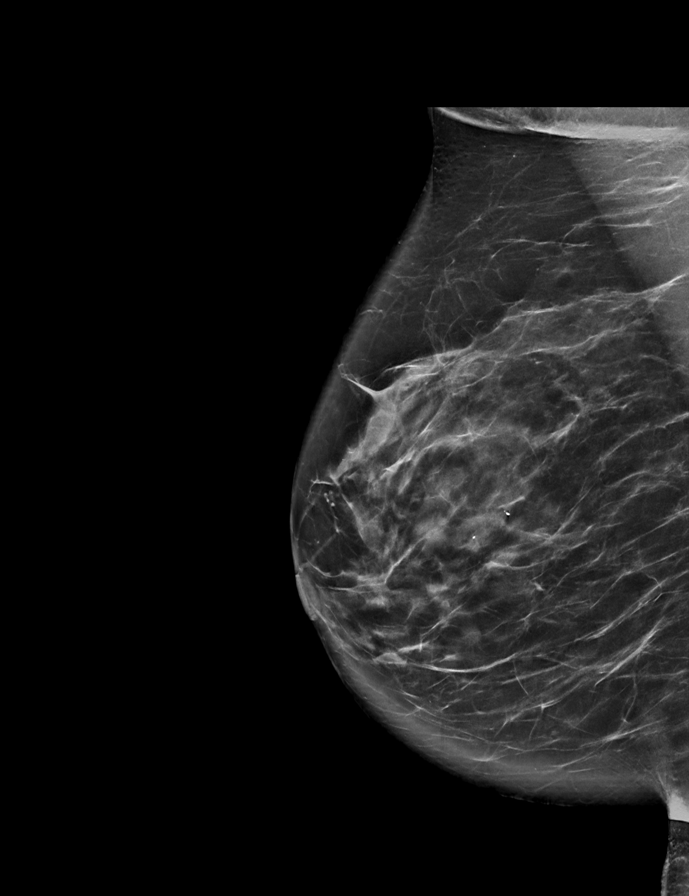

[L MLO synth-2D]
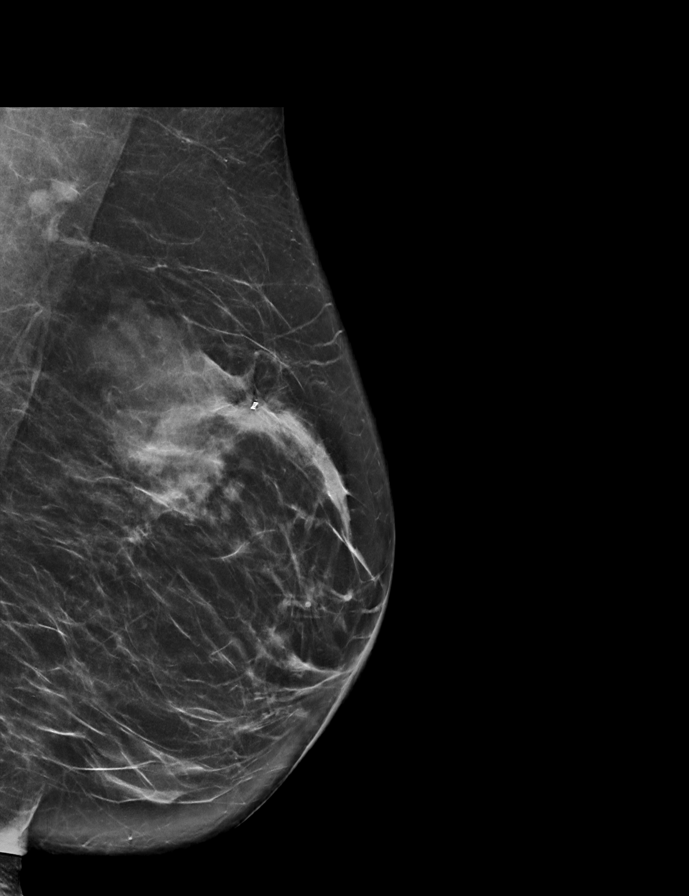

[R CC tomo · 2 of 73 frames shown]
[frame 24/73]
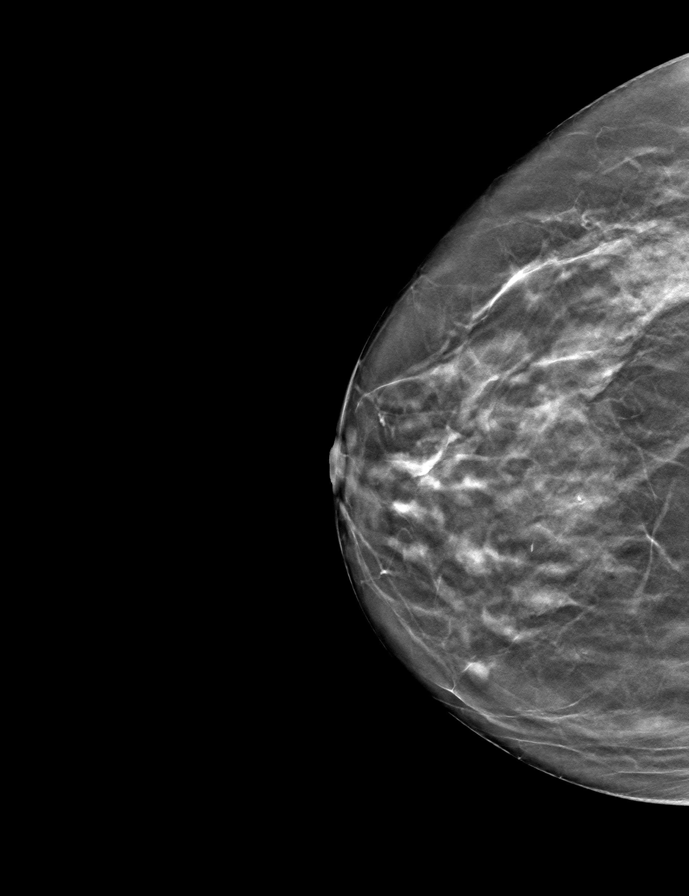
[frame 37/73]
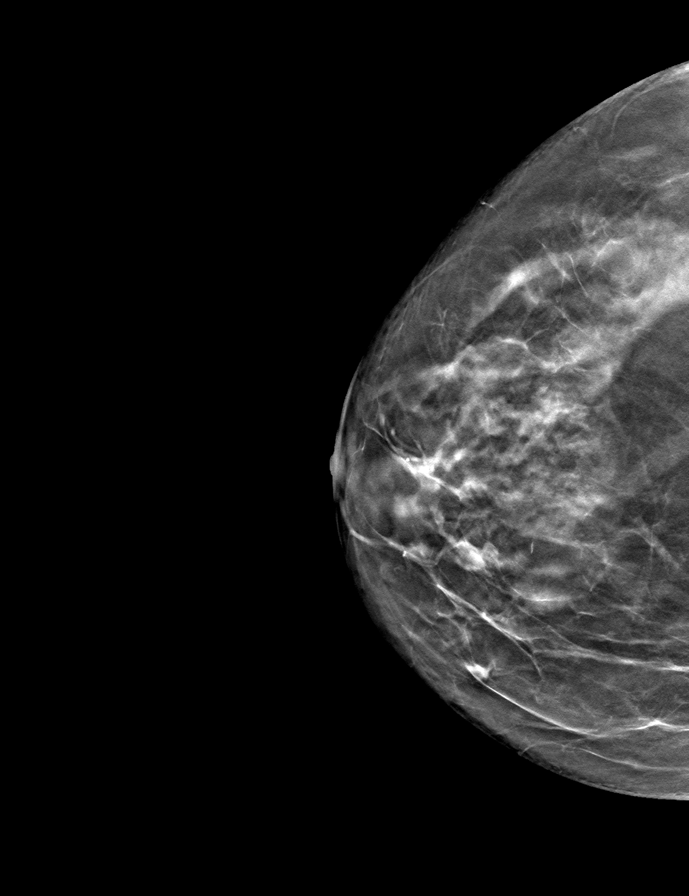

[L CC tomo · tomo slice 41/81.0]
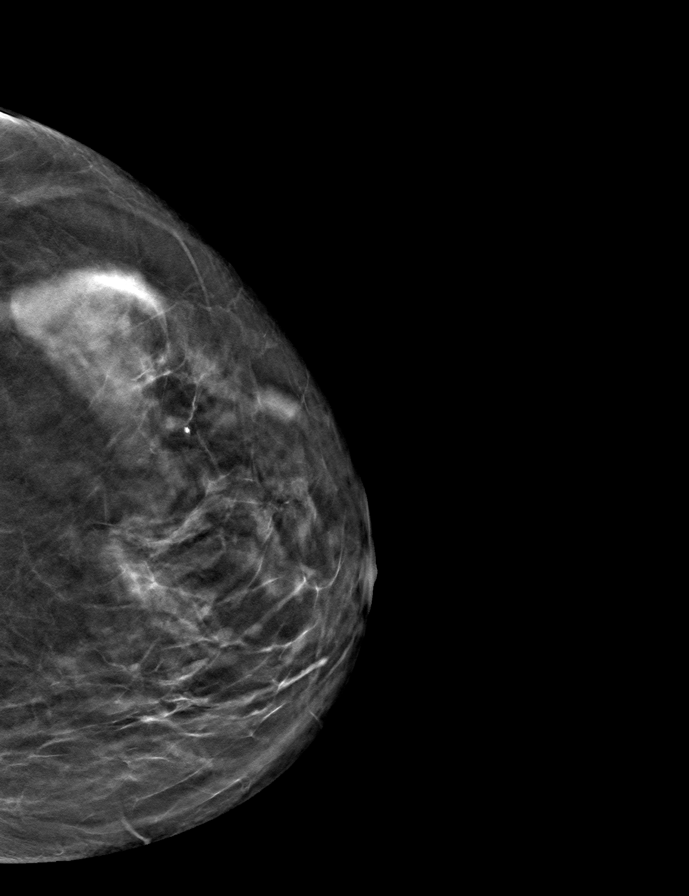

[R MLO tomo · tomo slice 39/78.0]
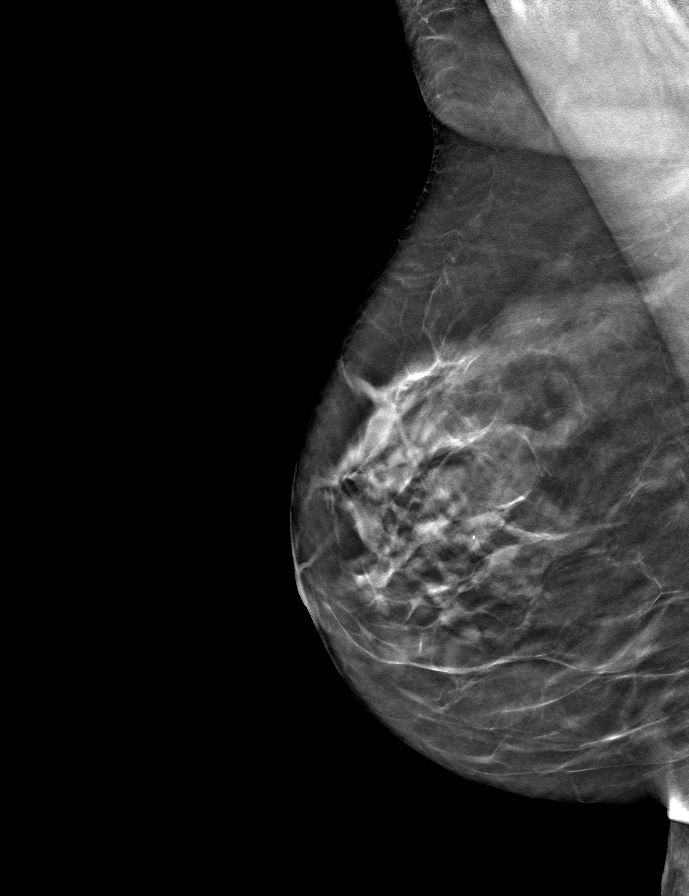

[L MLO tomo · tomo slice 39/76.0]
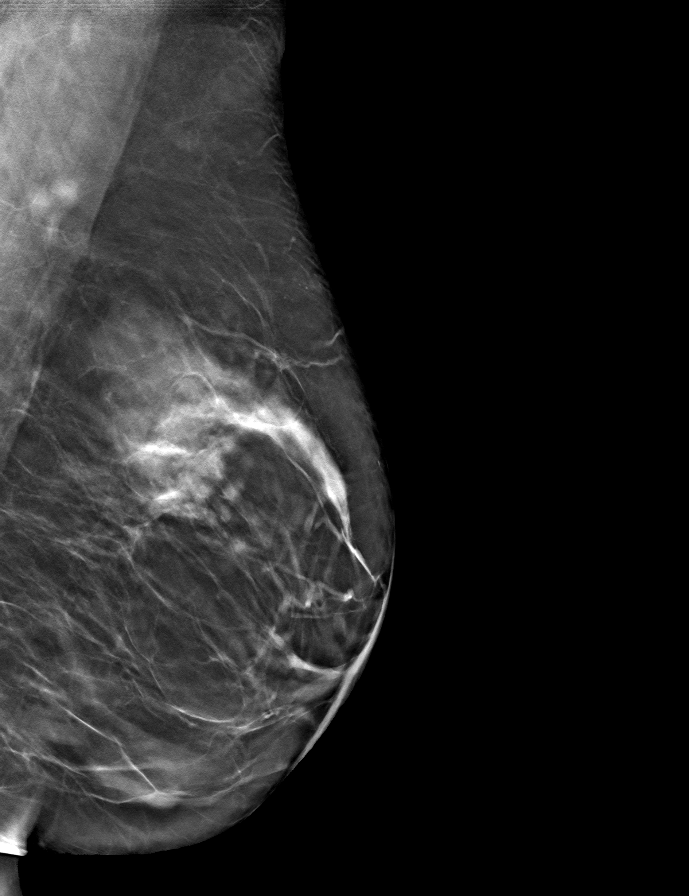

[9 of 24 positions shown; findings below may reference images not displayed]

ACR Breast Density Category c: The breast tissue is heterogeneously
dense, which may obscure small masses.
FINDINGS: There are no findings suspicious for malignancy.
IMPRESSION: No mammographic evidence of malignancy. A result letter of this
screening mammogram will be mailed directly to the patient.

RECOMMENDATION:
Screening mammogram in one year. (Code:Q3-W-BC3)

BI-RADS CATEGORY  1: Negative.

## 2023-09-13 ENCOUNTER — Other Ambulatory Visit: Payer: Self-pay | Admitting: Physician Assistant

## 2023-09-13 DIAGNOSIS — Z1231 Encounter for screening mammogram for malignant neoplasm of breast: Secondary | ICD-10-CM

## 2023-10-07 ENCOUNTER — Ambulatory Visit
Admission: RE | Admit: 2023-10-07 | Discharge: 2023-10-07 | Disposition: A | Discharge status: Home / Self Care | Payer: Medicare Other | Source: Ambulatory Visit | Attending: Physician Assistant | Admitting: Physician Assistant

## 2023-10-07 DIAGNOSIS — Z1231 Encounter for screening mammogram for malignant neoplasm of breast: Secondary | ICD-10-CM
